# Patient Record
Sex: Female | Born: 1960 | Race: White | Hispanic: No | Marital: Single
Health system: Southern US, Community
[De-identification: ages and names within clinical notes are randomized; demographics above are authoritative.]

## PROBLEM LIST (undated history)

## (undated) HISTORY — PX: BREAST EXCISIONAL BIOPSY: SUR124

---

## 2016-03-07 ENCOUNTER — Inpatient Hospital Stay
Admission: RE | Admit: 2016-03-07 | Discharge: 2016-03-07 | Disposition: A | Discharge status: Home / Self Care | Payer: Self-pay | Source: Ambulatory Visit | Attending: Family Medicine | Admitting: Family Medicine

## 2016-03-07 ENCOUNTER — Other Ambulatory Visit: Payer: Self-pay | Admitting: Family Medicine

## 2016-03-07 DIAGNOSIS — Z0000 Encounter for general adult medical examination without abnormal findings: Secondary | ICD-10-CM

## 2016-03-09 ENCOUNTER — Inpatient Hospital Stay
Admission: RE | Admit: 2016-03-09 | Discharge: 2016-03-09 | Disposition: A | Discharge status: Home / Self Care | Payer: Self-pay | Source: Ambulatory Visit | Attending: *Deleted | Admitting: *Deleted

## 2016-03-09 ENCOUNTER — Other Ambulatory Visit: Payer: Self-pay | Admitting: *Deleted

## 2016-03-09 ENCOUNTER — Other Ambulatory Visit: Payer: Self-pay | Admitting: Family Medicine

## 2016-03-09 DIAGNOSIS — Z9289 Personal history of other medical treatment: Secondary | ICD-10-CM

## 2016-03-09 DIAGNOSIS — Z1231 Encounter for screening mammogram for malignant neoplasm of breast: Secondary | ICD-10-CM

## 2016-03-27 ENCOUNTER — Ambulatory Visit: Payer: Self-pay

## 2016-04-10 ENCOUNTER — Ambulatory Visit: Payer: Self-pay

## 2016-04-24 ENCOUNTER — Other Ambulatory Visit: Payer: Self-pay | Admitting: Family Medicine

## 2016-04-24 ENCOUNTER — Ambulatory Visit
Admission: RE | Admit: 2016-04-24 | Discharge: 2016-04-24 | Disposition: A | Discharge status: Home / Self Care | Payer: BLUE CROSS/BLUE SHIELD | Source: Ambulatory Visit | Attending: Family Medicine | Admitting: Family Medicine

## 2016-04-24 DIAGNOSIS — Z1231 Encounter for screening mammogram for malignant neoplasm of breast: Secondary | ICD-10-CM | POA: Insufficient documentation

## 2019-10-20 ENCOUNTER — Other Ambulatory Visit: Payer: Self-pay | Admitting: Certified Nurse Midwife

## 2019-10-20 DIAGNOSIS — Z1231 Encounter for screening mammogram for malignant neoplasm of breast: Secondary | ICD-10-CM

## 2019-10-20 DIAGNOSIS — N6315 Unspecified lump in the right breast, overlapping quadrants: Secondary | ICD-10-CM

## 2019-10-23 ENCOUNTER — Ambulatory Visit: Payer: BLUE CROSS/BLUE SHIELD

## 2019-10-27 ENCOUNTER — Inpatient Hospital Stay
Admission: RE | Admit: 2019-10-27 | Discharge: 2019-10-27 | Disposition: A | Discharge status: Home / Self Care | Payer: Self-pay | Source: Ambulatory Visit | Attending: *Deleted | Admitting: *Deleted

## 2019-10-27 ENCOUNTER — Other Ambulatory Visit: Payer: Self-pay | Admitting: *Deleted

## 2019-10-27 DIAGNOSIS — Z1231 Encounter for screening mammogram for malignant neoplasm of breast: Secondary | ICD-10-CM

## 2019-10-30 ENCOUNTER — Ambulatory Visit
Admission: RE | Admit: 2019-10-30 | Discharge: 2019-10-30 | Disposition: A | Discharge status: Home / Self Care | Payer: BC Managed Care – PPO | Source: Ambulatory Visit | Attending: Certified Nurse Midwife | Admitting: Certified Nurse Midwife

## 2019-10-30 ENCOUNTER — Other Ambulatory Visit: Payer: Self-pay | Admitting: Certified Nurse Midwife

## 2019-10-30 DIAGNOSIS — N6315 Unspecified lump in the right breast, overlapping quadrants: Secondary | ICD-10-CM

## 2019-11-04 ENCOUNTER — Other Ambulatory Visit: Payer: Self-pay | Admitting: Certified Nurse Midwife

## 2019-11-04 DIAGNOSIS — R928 Other abnormal and inconclusive findings on diagnostic imaging of breast: Secondary | ICD-10-CM

## 2019-11-04 DIAGNOSIS — N6310 Unspecified lump in the right breast, unspecified quadrant: Secondary | ICD-10-CM

## 2019-11-10 ENCOUNTER — Ambulatory Visit
Admission: RE | Admit: 2019-11-10 | Discharge: 2019-11-10 | Disposition: A | Discharge status: Home / Self Care | Payer: BC Managed Care – PPO | Source: Ambulatory Visit | Attending: Certified Nurse Midwife | Admitting: Certified Nurse Midwife

## 2019-11-10 DIAGNOSIS — R928 Other abnormal and inconclusive findings on diagnostic imaging of breast: Secondary | ICD-10-CM | POA: Insufficient documentation

## 2019-11-10 DIAGNOSIS — N6310 Unspecified lump in the right breast, unspecified quadrant: Secondary | ICD-10-CM | POA: Insufficient documentation

## 2019-11-11 ENCOUNTER — Other Ambulatory Visit: Payer: Self-pay

## 2019-11-11 DIAGNOSIS — C50919 Malignant neoplasm of unspecified site of unspecified female breast: Secondary | ICD-10-CM

## 2019-11-12 NOTE — Progress Notes (Signed)
Received notice from [name] at [place] on 11/11/19.  Patient notified of biopsy results, and patient expecting navigation call.  Introduced Teaching laboratory technician. Patient stated she thought about going to [facility], but would like to go ahead and schedule appointments here.  Scheduled consults with Dr. Janese Banks, and Dr. Lysle Pearl.  Phoned patient this morning. She is seeking referral to [facility] from her primary provider, and would like to cancel appointments here.

## 2019-11-13 ENCOUNTER — Inpatient Hospital Stay: Payer: BC Managed Care – PPO | Admitting: Oncology

## 2019-11-17 ENCOUNTER — Other Ambulatory Visit: Payer: Self-pay | Admitting: Pathology

## 2019-11-17 LAB — SURGICAL PATHOLOGY

## 2020-10-22 IMAGING — MG MM BREAST LOCALIZATION CLIP
4 series · 4 of 12 positions shown · non-contrast
Comparison: Previous exam(s).

CLINICAL DATA: Patient is post ultrasound-guided core needle biopsy
of a suspicious 2.3 cm mass over the 12 o'clock position of the
right breast.

EXAM:
DIAGNOSTIC right MAMMOGRAM POST ultrasound BIOPSY

[R CC synth-2D]
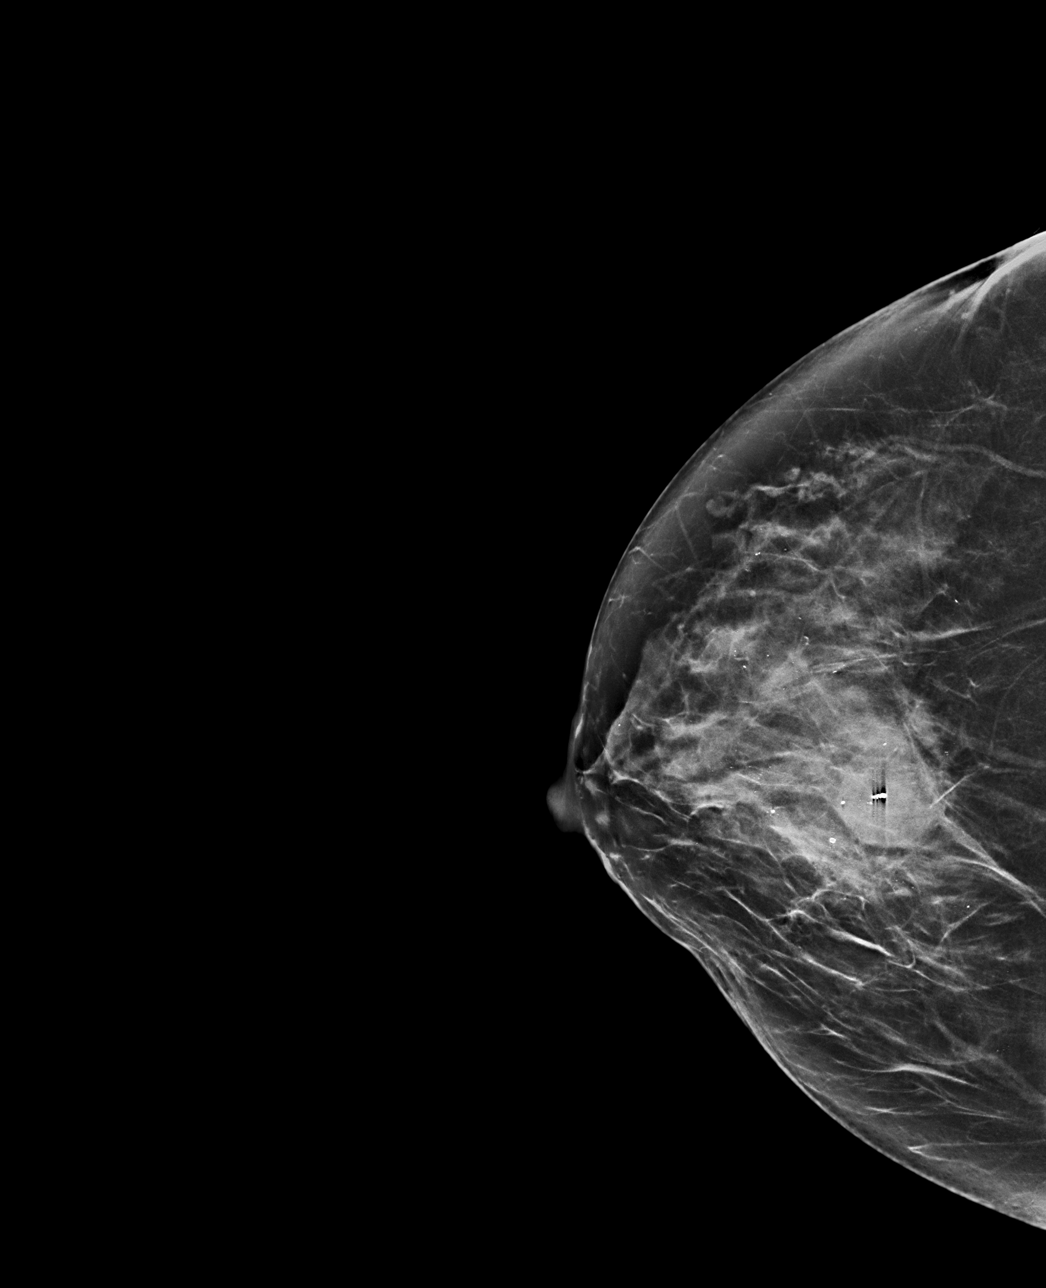

[R ML synth-2D]
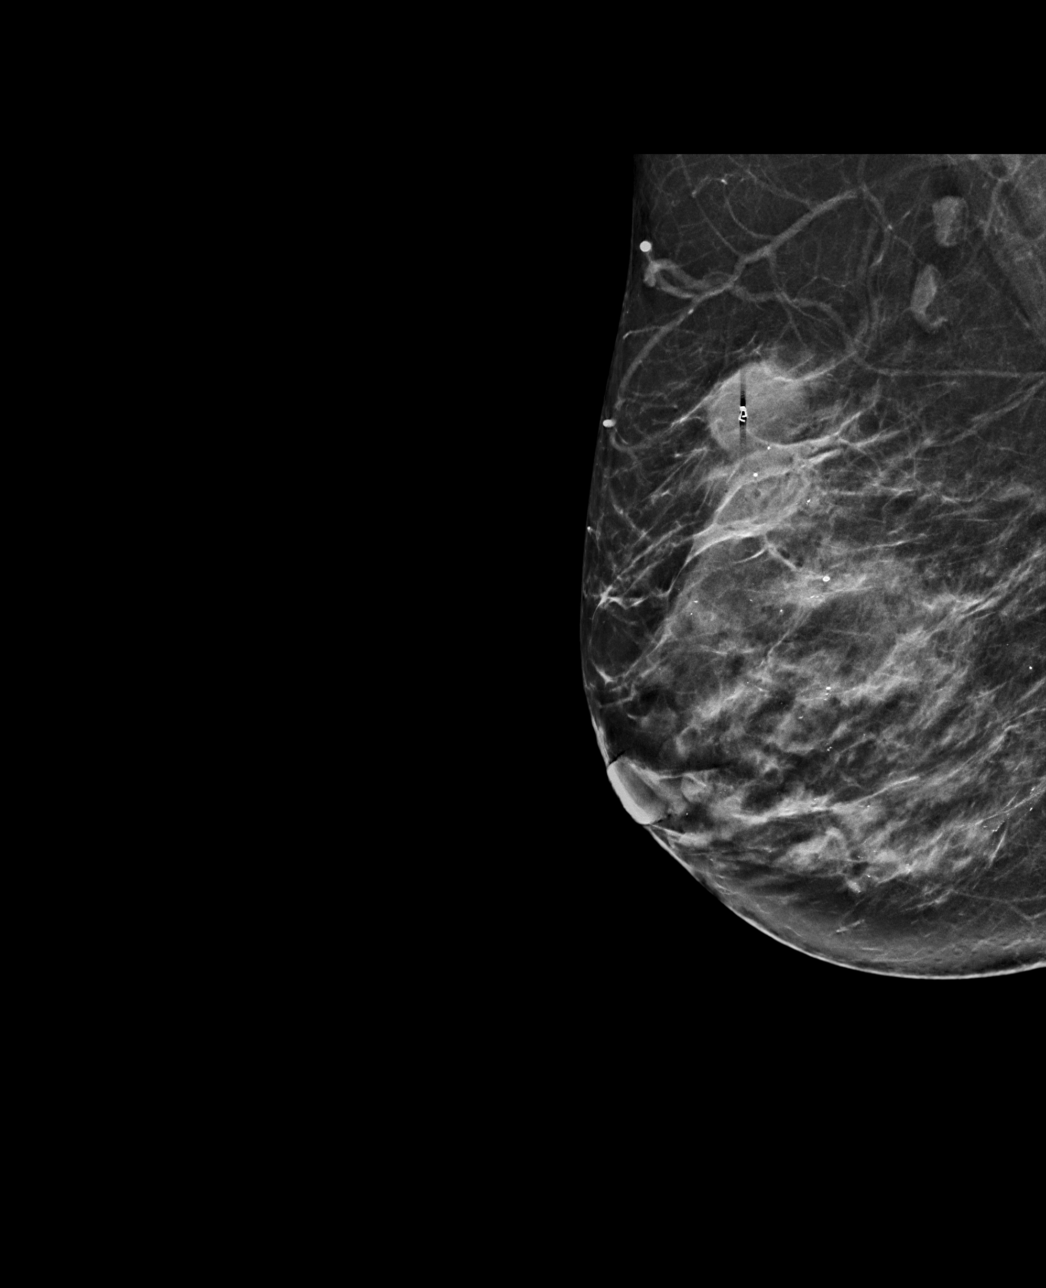

[R ML tomo · tomo slice 33/66.0]
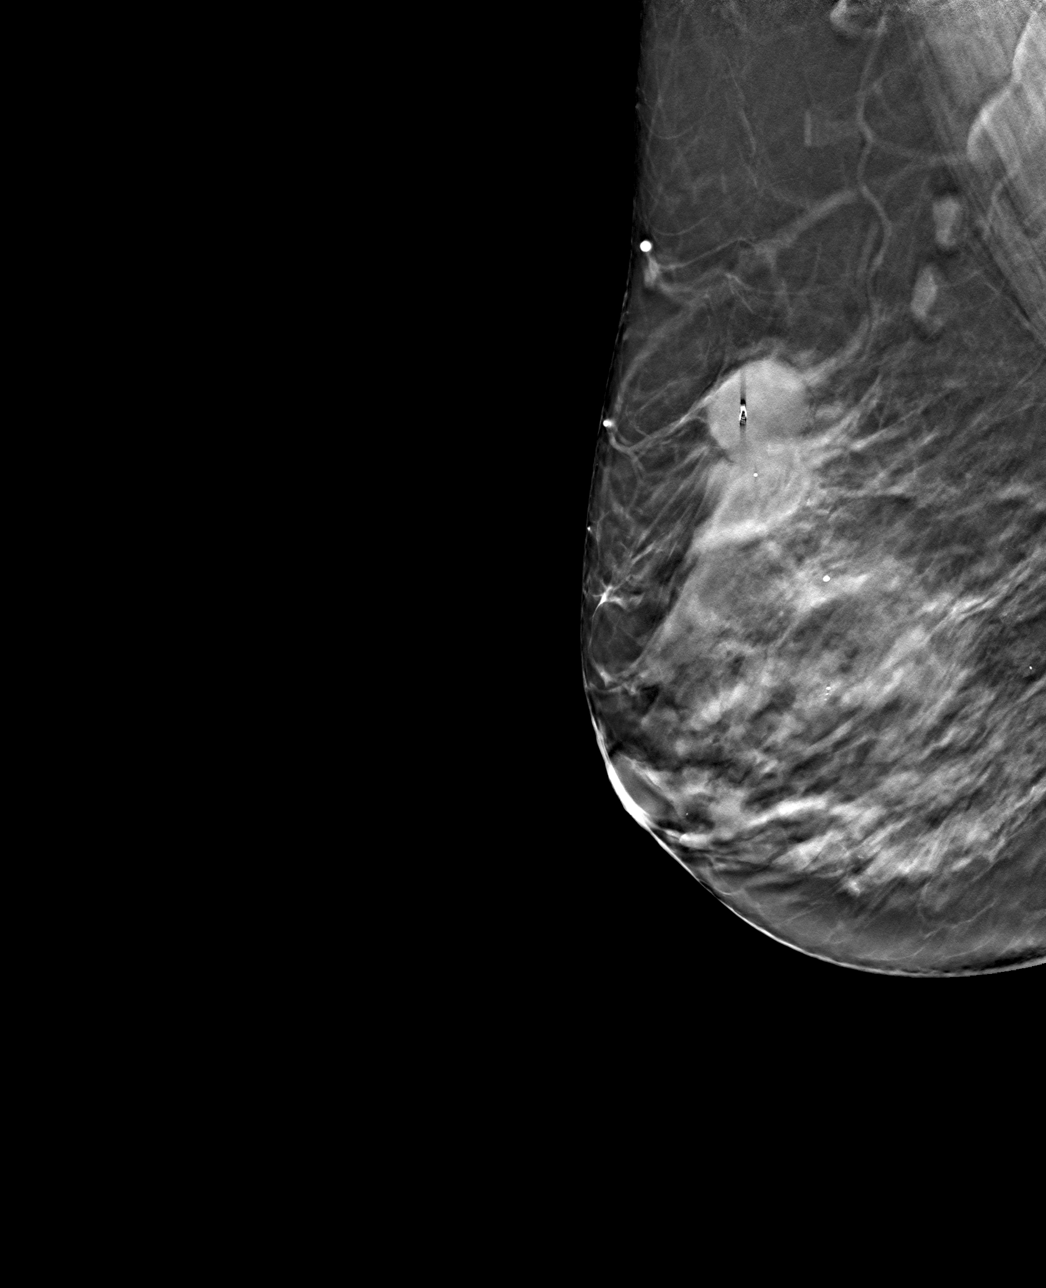

[R CC tomo · tomo slice 33/65.0]
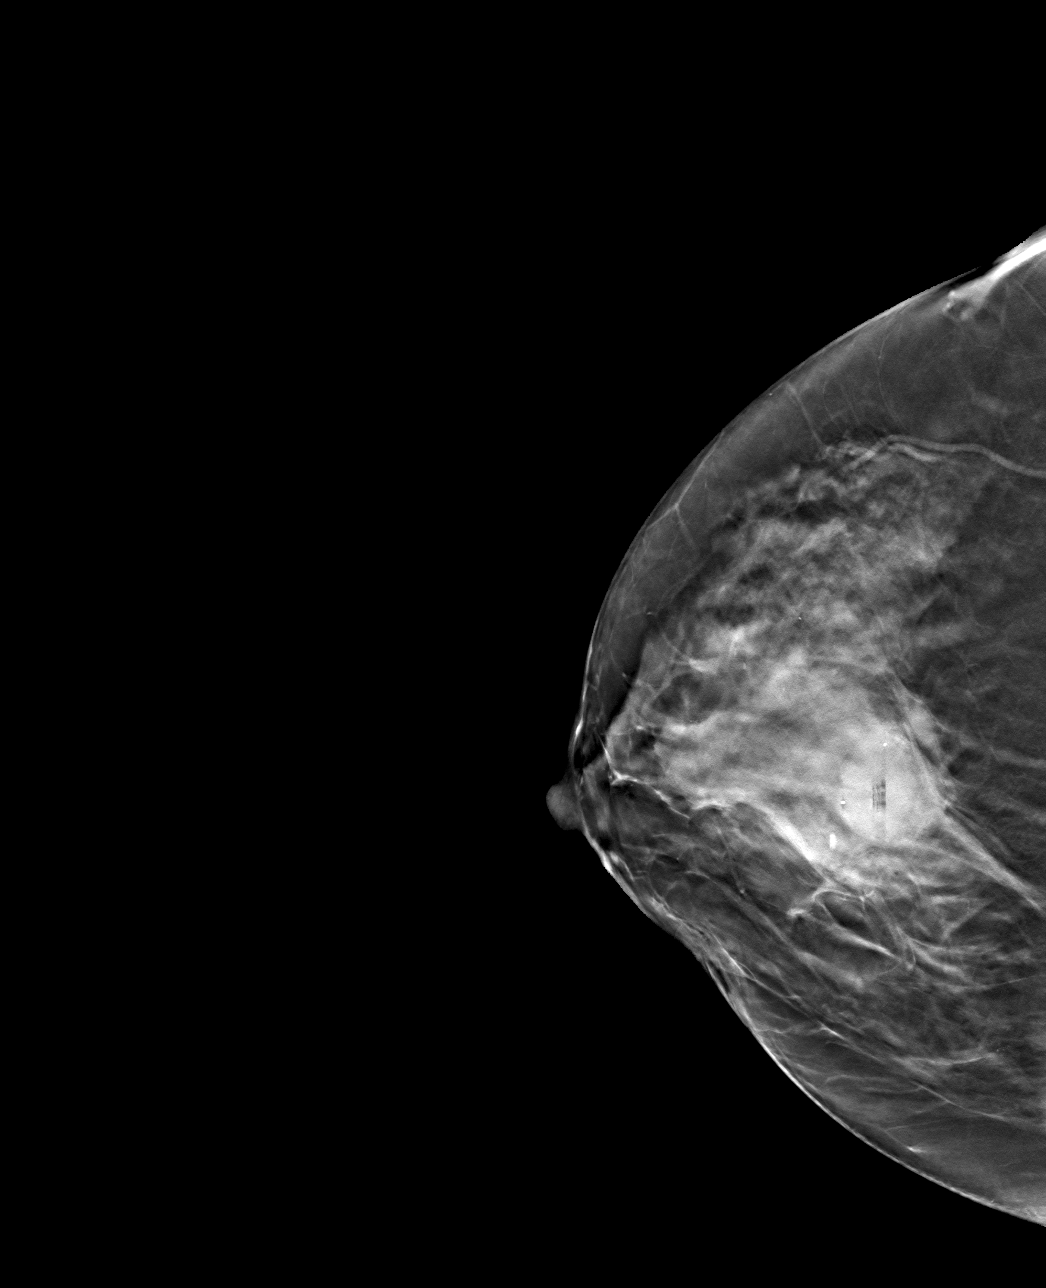

[4 of 12 positions shown; findings below may reference images not displayed]

FINDINGS: Mammographic images were obtained following ultrasound guided biopsy
of the targeted mass over the 12 o'clock position of the right
breast. The biopsy marking clip is in expected position at the site
of biopsy.
IMPRESSION: Appropriate positioning of the venous shaped biopsy marking clip at
the site of biopsy in the 12 o'clock position of the right breast.

Final Assessment: Post Procedure Mammograms for Marker Placement

## 2023-01-12 LAB — EXTERNAL GENERIC LAB PROCEDURE

## 2023-01-12 LAB — COLOGUARD

## 2023-02-18 LAB — COLOGUARD: COLOGUARD: NEGATIVE

## 2023-02-18 LAB — EXTERNAL GENERIC LAB PROCEDURE: COLOGUARD: NEGATIVE

## 2024-06-10 ENCOUNTER — Ambulatory Visit

## 2024-06-10 DIAGNOSIS — Z23 Encounter for immunization: Secondary | ICD-10-CM

## 2024-06-10 NOTE — Patient Instructions (Signed)
 Influenza Vaccine given in right arm by S. Cooper without difficulty
